# Patient Record
Sex: Female | Born: 1980 | Race: Black or African American | Hispanic: No | Marital: Single | Smoking: Current every day smoker
Health system: Southern US, Community
[De-identification: ages and names within clinical notes are randomized; demographics above are authoritative.]

---

## 2006-12-14 ENCOUNTER — Ambulatory Visit (HOSPITAL_COMMUNITY): Admission: RE | Admit: 2006-12-14 | Discharge: 2006-12-14 | Payer: Self-pay | Admitting: Obstetrics & Gynecology

## 2007-03-29 ENCOUNTER — Inpatient Hospital Stay (HOSPITAL_COMMUNITY): Admission: AD | Admit: 2007-03-29 | Discharge: 2007-04-01 | Payer: Self-pay | Admitting: Obstetrics & Gynecology

## 2007-03-29 ENCOUNTER — Ambulatory Visit: Payer: Self-pay | Admitting: Obstetrics & Gynecology

## 2010-05-26 ENCOUNTER — Emergency Department (HOSPITAL_COMMUNITY): Admission: EM | Admit: 2010-05-26 | Discharge: 2010-05-26 | Payer: Self-pay | Admitting: Emergency Medicine

## 2010-12-23 LAB — POCT URINALYSIS DIPSTICK
Bilirubin Urine: NEGATIVE
Glucose, UA: NEGATIVE mg/dL
Hgb urine dipstick: NEGATIVE
Ketones, ur: NEGATIVE mg/dL
Protein, ur: NEGATIVE mg/dL
Specific Gravity, Urine: 1.025 (ref 1.005–1.030)

## 2010-12-23 LAB — POCT RAPID STREP A (OFFICE): Streptococcus, Group A Screen (Direct): POSITIVE — AB

## 2011-02-21 NOTE — Op Note (Signed)
NAME:  Laura Coffey, Laura Coffey NO.:  [id number]   MEDICAL RECORD NO.:  [id number]          PATIENT TYPE:  INP   LOCATION:  9141                          FACILITY:  [facility]   PHYSICIAN:  Allie Bossier, MD        DATE OF BIRTH:  1980/12/06   DATE OF PROCEDURE:  DATE OF DISCHARGE:                               OPERATIVE REPORT   PREOPERATIVE DIAGNOSIS:  Multiparity, desires sterility.   POSTOPERATIVE DIAGNOSIS:  Multiparity, desires sterility.   PROCEDURE:  Application of Filshie clips.   SURGEON:  Myra C. Marice Potter, M.D.   ANESTHESIA:  Epidural, Malen Gauze, M.D.   COMPLICATIONS:  None.   ESTIMATED BLOOD LOSS:  Minimal.   SPECIMENS:  None.   FINDINGS:  Normal-appearing oviducts.   DETAILED PROCEDURE AND FINDINGS:  The risks, benefits and alternatives  of surgery were explained, understood and accepted. She understand the  1% failure rate and was able to verbalize it in the preoperative area  prior to entering the operating room. She declines alternative forms of  birth control. In the operating room, her epidural was bolused for  surgery. Her abdomen was prepped and draped in usual sterile fashion.  After adequate anesthesia was assured, approximately 10 mL of 0.25%  Marcaine was injected into the umbilical area. Careful inspection of her  umbilicus revealed a natural crease going in a transverse fashion across  her umbilicus. This area was elevated and incised with a #15 blade.  Fascia was elevated with Kocher clamps and incised with Mayo's. Using  army-navy retractors, the oviduct on the patient's right was visualized.  It was grasped with a Babcock clamp and traced to its fimbriated end. It  was then retraced to the isthmic region where a Filshie clip was placed  in the isthmic region of the tube, crossing the entire tube.  Approximately 1 mL of 0.25% Marcaine was injected on either side of the  clip for postoperative pain relief. A repeat procedure was performed on  the  left side, again visualizing the fimbriated end of the oviduct. At  the completion of the case, no bleeding was noted. Her fascia was  elevated with  Allis clamps and closed with a 0 Vicryl running nonlocking suture. A  subcuticular closure was done with 4-0 Vicryl suture. Excellent cosmetic  results were obtained. She was taken to the recovery room in stable  condition with the instrument, sponge and needle counts correct.      Allie Bossier, MD  Electronically Signed     MCD/MEDQ  D:  03/30/2007  T:  03/30/2007  Job:  [id number]

## 2011-07-26 LAB — CBC
HCT: 33.4 — ABNORMAL LOW
HCT: 35.1 — ABNORMAL LOW
Hemoglobin: 11.1 — ABNORMAL LOW
Hemoglobin: 11.5 — ABNORMAL LOW
Platelets: 330
Platelets: 336
RBC: 4.11
RDW: 16.6 — ABNORMAL HIGH

## 2011-07-26 LAB — RPR: RPR Ser Ql: NONREACTIVE

## 2012-12-09 ENCOUNTER — Encounter: Payer: Self-pay | Admitting: Obstetrics & Gynecology

## 2012-12-27 ENCOUNTER — Encounter: Payer: Self-pay | Admitting: Medical

## 2019-11-13 ENCOUNTER — Ambulatory Visit: Payer: Self-pay | Attending: Internal Medicine

## 2019-11-13 DIAGNOSIS — Z20822 Contact with and (suspected) exposure to covid-19: Secondary | ICD-10-CM

## 2019-11-14 LAB — NOVEL CORONAVIRUS, NAA: SARS-CoV-2, NAA: NOT DETECTED

## 2019-11-26 ENCOUNTER — Emergency Department (HOSPITAL_COMMUNITY): Payer: Self-pay

## 2019-11-26 ENCOUNTER — Encounter (HOSPITAL_COMMUNITY): Payer: Self-pay

## 2019-11-26 ENCOUNTER — Other Ambulatory Visit: Payer: Self-pay

## 2019-11-26 ENCOUNTER — Emergency Department (HOSPITAL_COMMUNITY)
Admission: EM | Admit: 2019-11-26 | Discharge: 2019-11-26 | Disposition: A | Discharge status: Home / Self Care | Payer: Self-pay | Attending: Emergency Medicine | Admitting: Emergency Medicine

## 2019-11-26 DIAGNOSIS — Z859 Personal history of malignant neoplasm, unspecified: Secondary | ICD-10-CM | POA: Insufficient documentation

## 2019-11-26 DIAGNOSIS — R0781 Pleurodynia: Secondary | ICD-10-CM | POA: Insufficient documentation

## 2019-11-26 DIAGNOSIS — R071 Chest pain on breathing: Secondary | ICD-10-CM | POA: Insufficient documentation

## 2019-11-26 DIAGNOSIS — M549 Dorsalgia, unspecified: Secondary | ICD-10-CM | POA: Insufficient documentation

## 2019-11-26 DIAGNOSIS — Z20822 Contact with and (suspected) exposure to covid-19: Secondary | ICD-10-CM

## 2019-11-26 DIAGNOSIS — R0789 Other chest pain: Secondary | ICD-10-CM

## 2019-11-26 DIAGNOSIS — F17200 Nicotine dependence, unspecified, uncomplicated: Secondary | ICD-10-CM | POA: Insufficient documentation

## 2019-11-26 LAB — I-STAT CHEM 8, ED
BUN: 3 mg/dL — ABNORMAL LOW (ref 6–20)
Calcium, Ion: 1.09 mmol/L — ABNORMAL LOW (ref 1.15–1.40)
Chloride: 104 mmol/L (ref 98–111)
Creatinine, Ser: 0.7 mg/dL (ref 0.44–1.00)
Glucose, Bld: 89 mg/dL (ref 70–99)
HCT: 36 % (ref 36.0–46.0)
Hemoglobin: 12.2 g/dL (ref 12.0–15.0)
Potassium: 3.8 mmol/L (ref 3.5–5.1)
Sodium: 136 mmol/L (ref 135–145)
TCO2: 23 mmol/L (ref 22–32)

## 2019-11-26 LAB — I-STAT BETA HCG BLOOD, ED (MC, WL, AP ONLY): I-stat hCG, quantitative: <5 m[IU]/mL (ref <5)

## 2019-11-26 LAB — POC SARS CORONAVIRUS 2 AG -  ED: SARS Coronavirus 2 Ag: NEGATIVE

## 2019-11-26 LAB — D-DIMER, QUANTITATIVE (NOT AT ARMC): D-Dimer, Quant: 0.33 ug/mL-FEU (ref 0.00–0.50)

## 2019-11-26 MED ORDER — ACETAMINOPHEN 500 MG PO TABS
1000.0000 mg | ORAL_TABLET | Freq: Once | ORAL | Status: AC
  Administered 2019-11-26: 17:00:00 1000 mg via ORAL
  Filled 2019-11-26: qty 2

## 2019-11-26 MED ORDER — NAPROXEN 375 MG PO TABS
375.0000 mg | ORAL_TABLET | Freq: Two times a day (BID) | ORAL | 0 refills | Status: AC
Start: 2019-11-26 — End: 2019-12-01

## 2019-11-26 MED ORDER — ALBUTEROL SULFATE HFA 108 (90 BASE) MCG/ACT IN AERS
2.0000 | INHALATION_SPRAY | Freq: Once | RESPIRATORY_TRACT | Status: AC
  Administered 2019-11-26: 2 via RESPIRATORY_TRACT
  Filled 2019-11-26: qty 6.7

## 2019-11-26 NOTE — ED Notes (Signed)
Pt ambulated, SPO2 remained 100% on RA

## 2019-11-26 NOTE — ED Provider Notes (Signed)
[facility] EMERGENCY DEPARTMENT Provider Note   CSN: [id number] Arrival date & time: 11/26/19  1453     History Chief Complaint  Patient presents with  . Shortness of Breath  . Generalized Body Aches    Laura Coffey is a 39 y.o. female.  HPI   39 year old female with a history of cholecystectomy presenting for evaluation of shortness of breath, right-sided chest and back pain.  States this started about a week ago.  The pain is worse with inspiration.  She has had a mild associated cough.  She reports subjective fevers at home.  The pain is worse when she lays on her right side.  She denies any nausea, vomiting, diarrhea.  She has never had similar symptoms in the past. Denies leg pain/swelling, hemoptysis, recent surgery/trauma, recent long travel, hormone use, personal hx of cancer, or hx of DVT/PE.    History reviewed. No pertinent past medical history.  There are no problems to display for this patient.   History reviewed. No pertinent surgical history.   OB History   No obstetric history on file.     History reviewed. No pertinent family history.  Social History   Tobacco Use  . Smoking status: Current Every Day Smoker  . Smokeless tobacco: Never Used  Substance Use Topics  . Alcohol use: Not Currently  . Drug use: Not Currently    Home Medications Prior to Admission medications   Not on File    Allergies    Patient has no known allergies.  Review of Systems   Review of Systems  Constitutional: Negative for fever.  HENT: Negative for ear pain and sore throat.   Eyes: Negative for visual disturbance.  Respiratory: Positive for cough and shortness of breath.   Cardiovascular: Positive for chest pain. Negative for palpitations and leg swelling.  Gastrointestinal: Negative for abdominal pain, constipation, diarrhea, nausea and vomiting.  Genitourinary: Negative for dysuria and hematuria.  Musculoskeletal: Positive for back  pain.  Skin: Negative for rash.  Neurological: Negative for headaches.  All other systems reviewed and are negative.   Physical Exam Updated Vital Signs BP 125/90   Pulse 80   Temp 99.1 F (37.3 C) (Oral)   Resp (!) 25   Ht 5\' 3"  (1.6 m)   Wt 52.2 kg   SpO2 100%   BMI 20.37 kg/m   Physical Exam Vitals and nursing note reviewed.  Constitutional:      General: She is not in acute distress.    Appearance: She is well-developed.  HENT:     Head: Normocephalic and atraumatic.  Eyes:     Conjunctiva/sclera: Conjunctivae normal.  Cardiovascular:     Rate and Rhythm: Normal rate and regular rhythm.     Heart sounds: No murmur.  Pulmonary:     Effort: Pulmonary effort is normal. No respiratory distress.     Breath sounds: Normal breath sounds. No decreased breath sounds, wheezing, rhonchi or rales.  Chest:     Chest wall: Tenderness (right side of the chest and mid back) present.  Abdominal:     Palpations: Abdomen is soft.     Tenderness: There is abdominal tenderness (mild epigastric). There is no guarding or rebound.  Musculoskeletal:     Cervical back: Neck supple.     Right lower leg: No tenderness. No edema.     Left lower leg: No tenderness. No edema.  Skin:    General: Skin is warm and dry.     Findings:  No rash.  Neurological:     Mental Status: She is alert.     ED Results / Procedures / Treatments   Labs (all labs ordered are listed, but only abnormal results are displayed) Labs Reviewed  POC SARS CORONAVIRUS 2 AG -  ED    EKG None  Radiology DG Chest 2 View  Result Date: 11/26/2019 CLINICAL DATA:  Chest pain EXAM: CHEST - 2 VIEW COMPARISON:  None. FINDINGS: Lungs are clear. Heart size and pulmonary vascularity are normal. No adenopathy. No pneumothorax. No bone lesions. There are surgical clips in the upper abdomen on the right. IMPRESSION: Lungs clear.  Heart size normal.  No adenopathy. Electronically Signed   By: Lowella Grip III M.D.   On:  11/26/2019 15:53    Procedures Procedures (including critical care time)  Medications Ordered in ED Medications  acetaminophen (TYLENOL) tablet 1,000 mg (1,000 mg Oral Given 11/26/19 1714)    ED Course  I have reviewed the triage vital signs and the nursing notes.  Pertinent labs & imaging results that were available during my care of the patient were reviewed by me and considered in my medical decision making (see chart for details).    MDM Rules/Calculators/A&P                      39 y/o F with right rib pain worse with inspiration. Also with cough and subjective fevers   Chem 8 with normal renal function and electrolytes. Normal hgb.  Beta hcg neg ddimer neg POC COVID neg Send out COVID obtained  cxr personally reviewed and neg for pneumonia, pneumothorax or other acute abnormality  Pt given albuterol and tylenol, and on reassessment she states her breathing has improved.  Discussed findings.  Discussed suspected diagnosis of Covid.  She also could have MSK pain given that I can reproduce her chest tenderness on exam.  Will DC with albuterol inhaler. Will also give naproxen for home.  ----  Laura Coffey was evaluated in Emergency Department on 11/26/2019 for the symptoms described in the history of present illness. She was evaluated in the context of the global COVID-19 pandemic, which necessitated consideration that the patient might be at risk for infection with the SARS-CoV-2 virus that causes COVID-19. Institutional protocols and algorithms that pertain to the evaluation of patients at risk for COVID-19 are in a state of rapid change based on information released by regulatory bodies including the CDC and federal and state organizations. These policies and algorithms were followed during the patient's care in the ED.  Final Clinical Impression(s) / ED Diagnoses Final diagnoses:  None    Rx / DC Orders ED Discharge Orders    None       Bishop Dublin 11/26/19 1946    Noemi Chapel, MD 11/28/19 1322

## 2019-11-26 NOTE — ED Notes (Signed)
Patient verbalizes understanding of discharge instructions. Opportunity for questioning and answers were provided. Armband removed by staff, pt discharged from ED ambulatory.   

## 2019-11-26 NOTE — Discharge Instructions (Addendum)
Take two puffs of the albuterol inhaler every 4 hours as needed for cough, shortness of breath, or wheezing.   Your COVID test results will be available in the next 1-2 days. If the results are positive, you will be contacted by the hospital.  If the results are positive, you should be isolated for at least 7 days since the onset of your symptoms AND >72 hours after symptoms resolution (absence of fever without the use of fever reducing medication and improvement in respiratory symptoms), whichever is longer  Please follow up with your primary care provider within 5-7 days for re-evaluation of your symptoms. If you do not have a primary care provider, information for a healthcare clinic has been provided for you to make arrangements for follow up care. Please return to the emergency department for any new or worsening symptoms including shortness of breath or chest pain.

## 2019-11-26 NOTE — ED Triage Notes (Signed)
Pt arrives POV for eval of shortness of breath and back pain worse w/ inspiration. Pt reports the pain radiates from under her ribcage around to her back. Denies fever,cough, chills, known covid contacts. NARD in triage

## 2019-11-27 LAB — NOVEL CORONAVIRUS, NAA (HOSP ORDER, SEND-OUT TO REF LAB; TAT 18-24 HRS): SARS-CoV-2, NAA: NOT DETECTED

## 2021-05-05 IMAGING — DX DG CHEST 2V
2 series · 2 of 2 positions shown · non-contrast
Comparison: None.

CLINICAL DATA: Chest pain

EXAM:
CHEST - 2 VIEW

[chest pa]
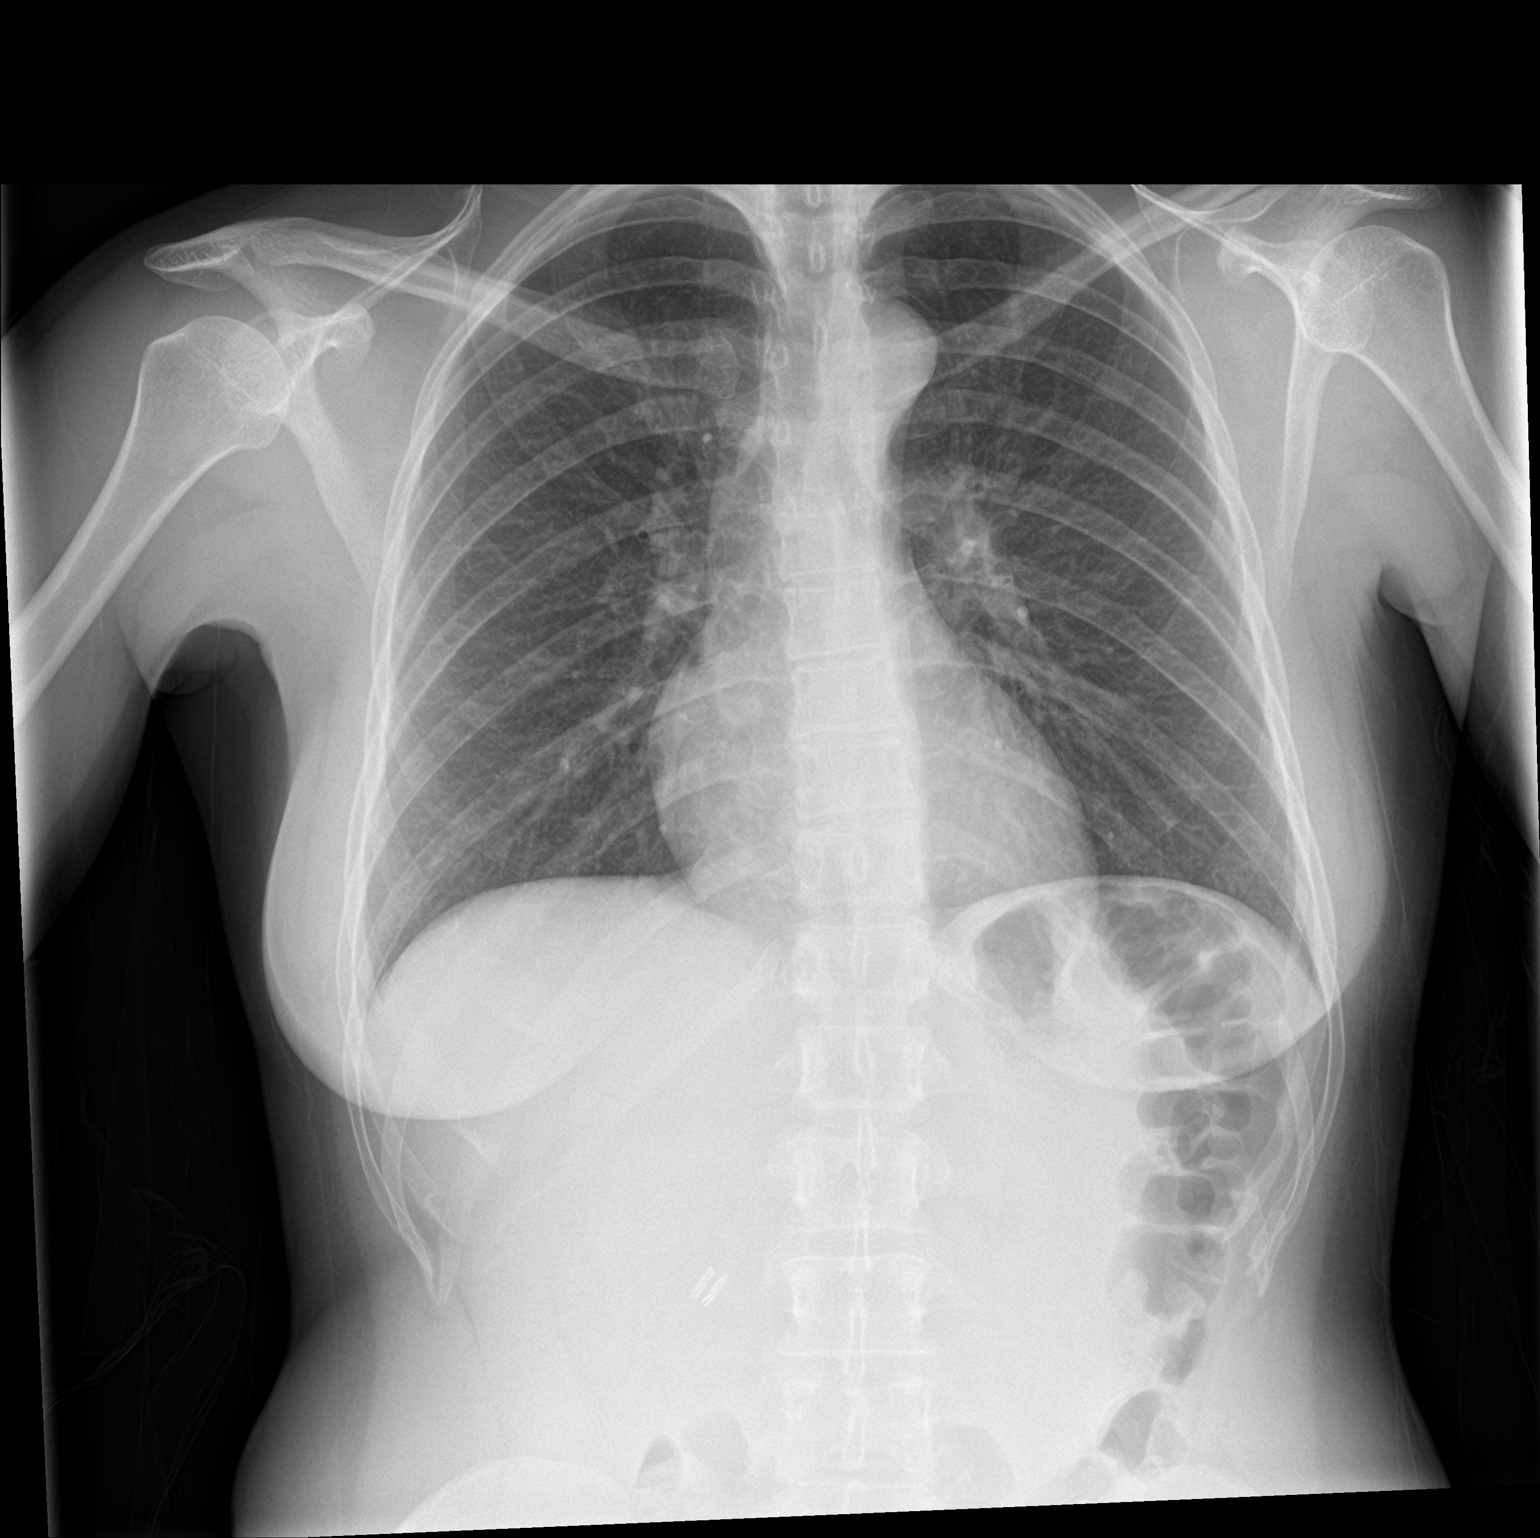

[chest lat]
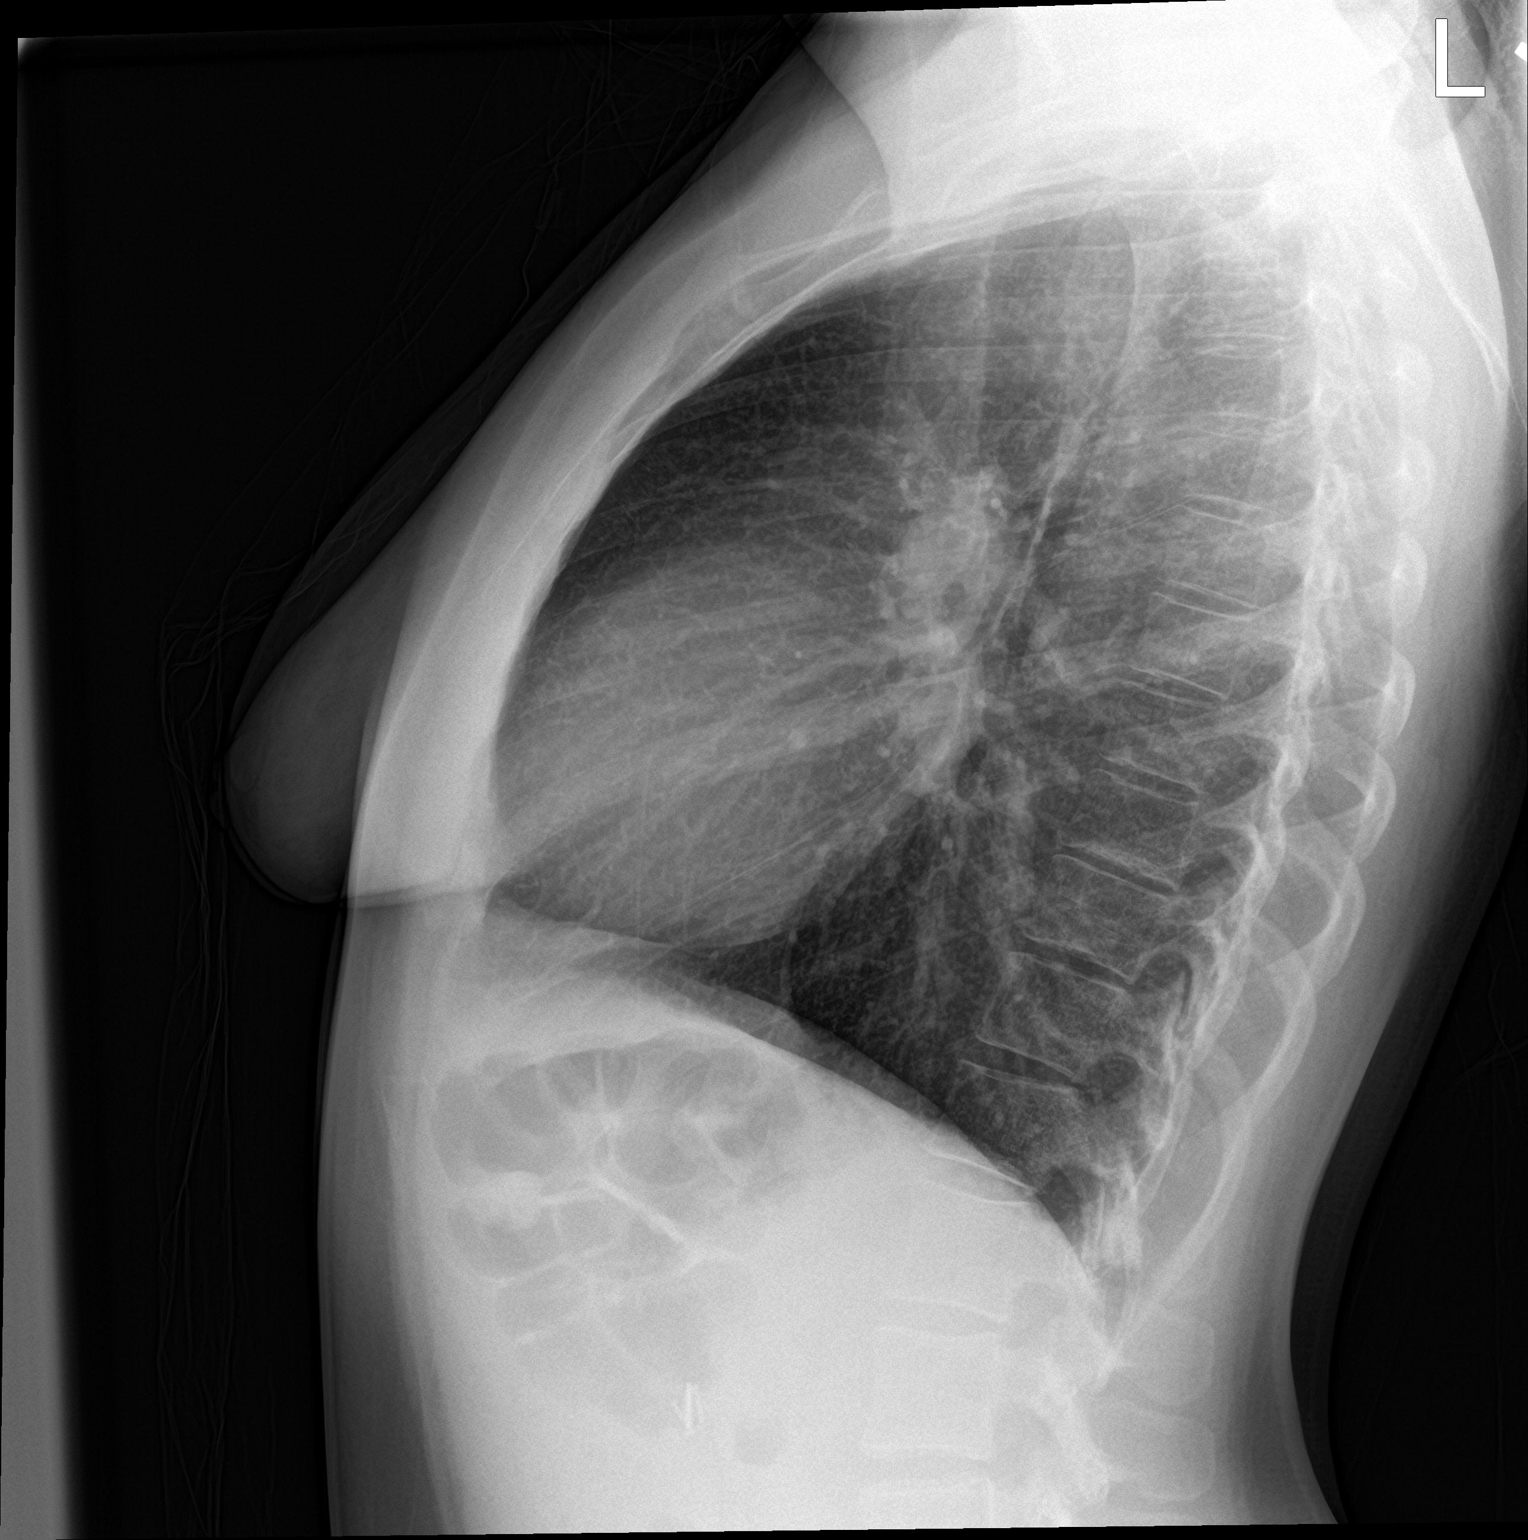

[2 of 2 positions shown; findings below may reference images not displayed]

FINDINGS: Lungs are clear. Heart size and pulmonary vascularity are normal. No
adenopathy. No pneumothorax. No bone lesions. There are surgical
clips in the upper abdomen on the right.
IMPRESSION: Lungs clear.  Heart size normal.  No adenopathy.

## 2021-09-07 ENCOUNTER — Ambulatory Visit: Payer: Self-pay

## 2022-06-02 ENCOUNTER — Emergency Department (HOSPITAL_BASED_OUTPATIENT_CLINIC_OR_DEPARTMENT_OTHER): Payer: Medicaid Other

## 2022-06-02 ENCOUNTER — Emergency Department (HOSPITAL_BASED_OUTPATIENT_CLINIC_OR_DEPARTMENT_OTHER)
Admission: EM | Admit: 2022-06-02 | Discharge: 2022-06-02 | Disposition: A | Discharge status: Home / Self Care | Payer: Medicaid Other | Attending: Emergency Medicine | Admitting: Emergency Medicine

## 2022-06-02 ENCOUNTER — Other Ambulatory Visit: Payer: Self-pay

## 2022-06-02 ENCOUNTER — Encounter (HOSPITAL_BASED_OUTPATIENT_CLINIC_OR_DEPARTMENT_OTHER): Payer: Self-pay

## 2022-06-02 DIAGNOSIS — N200 Calculus of kidney: Secondary | ICD-10-CM | POA: Insufficient documentation

## 2022-06-02 DIAGNOSIS — E876 Hypokalemia: Secondary | ICD-10-CM | POA: Diagnosis not present

## 2022-06-02 DIAGNOSIS — R1011 Right upper quadrant pain: Secondary | ICD-10-CM | POA: Diagnosis present

## 2022-06-02 DIAGNOSIS — D72829 Elevated white blood cell count, unspecified: Secondary | ICD-10-CM | POA: Insufficient documentation

## 2022-06-02 DIAGNOSIS — N12 Tubulo-interstitial nephritis, not specified as acute or chronic: Secondary | ICD-10-CM

## 2022-06-02 DIAGNOSIS — K529 Noninfective gastroenteritis and colitis, unspecified: Secondary | ICD-10-CM | POA: Diagnosis not present

## 2022-06-02 LAB — COMPREHENSIVE METABOLIC PANEL
ALT: 7 U/L (ref 0–44)
AST: 12 U/L — ABNORMAL LOW (ref 15–41)
Albumin: 4.8 g/dL (ref 3.5–5.0)
Alkaline Phosphatase: 51 U/L (ref 38–126)
Anion gap: 8 (ref 5–15)
BUN: 7 mg/dL (ref 6–20)
CO2: 27 mmol/L (ref 22–32)
Calcium: 9.5 mg/dL (ref 8.9–10.3)
Chloride: 102 mmol/L (ref 98–111)
Creatinine, Ser: 0.94 mg/dL (ref 0.44–1.00)
GFR, Estimated: >60 mL/min (ref >60)
Glucose, Bld: 99 mg/dL (ref 70–99)
Potassium: 2.9 mmol/L — ABNORMAL LOW (ref 3.5–5.1)
Sodium: 137 mmol/L (ref 135–145)
Total Bilirubin: 1 mg/dL (ref 0.3–1.2)
Total Protein: 8 g/dL (ref 6.5–8.1)

## 2022-06-02 LAB — CBC
HCT: 39.9 % (ref 36.0–46.0)
Hemoglobin: 13.8 g/dL (ref 12.0–15.0)
MCH: 32.2 pg (ref 26.0–34.0)
MCHC: 34.6 g/dL (ref 30.0–36.0)
MCV: 93 fL (ref 80.0–100.0)
Platelets: 247 10*3/uL (ref 150–400)
RBC: 4.29 MIL/uL (ref 3.87–5.11)
RDW: 13.1 % (ref 11.5–15.5)
WBC: 17 10*3/uL — ABNORMAL HIGH (ref 4.0–10.5)
nRBC: 0 % (ref 0.0–0.2)

## 2022-06-02 LAB — URINALYSIS, ROUTINE W REFLEX MICROSCOPIC
Bilirubin Urine: NEGATIVE
Glucose, UA: NEGATIVE mg/dL
Nitrite: POSITIVE — AB
Protein, ur: 30 mg/dL — AB
RBC / HPF: >50 RBC/hpf — ABNORMAL HIGH (ref 0–5)
Specific Gravity, Urine: 1.018 (ref 1.005–1.030)
pH: 5.5 (ref 5.0–8.0)

## 2022-06-02 LAB — LIPASE, BLOOD: Lipase: 24 U/L (ref 11–51)

## 2022-06-02 LAB — HCG, SERUM, QUALITATIVE: Preg, Serum: NEGATIVE

## 2022-06-02 MED ORDER — SODIUM CHLORIDE 0.9 % IV SOLN
1.0000 g | Freq: Once | INTRAVENOUS | Status: AC
  Administered 2022-06-02: 1 g via INTRAVENOUS
  Filled 2022-06-02: qty 10

## 2022-06-02 MED ORDER — IOHEXOL 300 MG/ML  SOLN
100.0000 mL | Freq: Once | INTRAMUSCULAR | Status: AC | PRN
  Administered 2022-06-02: 80 mL via INTRAVENOUS

## 2022-06-02 MED ORDER — POTASSIUM CHLORIDE ER 10 MEQ PO TBCR: 10.0000 meq | EXTENDED_RELEASE_TABLET | Freq: Every day | ORAL | 0 refills | Status: AC

## 2022-06-02 MED ORDER — POTASSIUM CHLORIDE CRYS ER 20 MEQ PO TBCR
40.0000 meq | EXTENDED_RELEASE_TABLET | Freq: Once | ORAL | Status: AC
  Administered 2022-06-02: 40 meq via ORAL
  Filled 2022-06-02: qty 2

## 2022-06-02 MED ORDER — CEPHALEXIN 500 MG PO CAPS: 500.0000 mg | ORAL_CAPSULE | Freq: Three times a day (TID) | ORAL | 0 refills | Status: AC

## 2022-06-02 NOTE — ED Triage Notes (Signed)
Patient here POV from Home.  Endorses Right lateral ABD Pain that began 2 Days ago and radiates to Epigastric ABD.  No N/V/D. Subjective Fevers. No Dysuria. Some Positions worsen Pain. Constant.   NAD Noted during Triage. A&Ox4.GCS 15. Ambulatory.

## 2022-06-02 NOTE — ED Provider Notes (Signed)
[facility] EMERGENCY DEPT Provider Note   CSN: [id number] Arrival date & time: 06/02/22  1428     History  Chief Complaint  Patient presents with   Abdominal Pain    Laura Coffey is a 41 y.o. female presenting to the ED with complaint of abdominal pain.  She reports that she has had chronic pain on the right side for many years but 2 days ago seem to get much much worse.  She describes pain in the right upper quadrant that wraps around to the epigastrium.  She says it is constant, but worse with movement.  She has not had this feeling before.  She denies dysuria, hematuria.  She denies nausea, vomiting, diarrhea.  She reports a history of a cholecystectomy.  She also reports a history of tubal ligation.  She denies any other abdominal surgeries.  HPI     Home Medications Prior to Admission medications   Medication Sig Start Date End Date Taking? Authorizing Provider  cephALEXin (KEFLEX) 500 MG capsule Take 1 capsule (500 mg total) by mouth 3 (three) times daily for 10 days. 06/03/22 06/13/22 Yes Ladye Macnaughton, Kermit Balo, MD  potassium chloride (KLOR-CON) 10 MEQ tablet Take 1 tablet (10 mEq total) by mouth daily for 30 doses. 06/02/22 07/02/22 Yes Makeisha Jentsch, Kermit Balo, MD      Allergies    Patient has no known allergies.    Review of Systems   Review of Systems  Physical Exam Updated Vital Signs BP 110/72   Pulse 76   Temp 99.1 F (37.3 C) (Oral)   Resp 17   Ht 5\' 3"  (1.6 m)   Wt 52.2 kg   SpO2 100%   BMI 20.39 kg/m  Physical Exam Constitutional:      General: She is not in acute distress. HENT:     Head: Normocephalic and atraumatic.  Eyes:     Conjunctiva/sclera: Conjunctivae normal.     Pupils: Pupils are equal, round, and reactive to light.  Cardiovascular:     Rate and Rhythm: Normal rate and regular rhythm.  Pulmonary:     Effort: Pulmonary effort is normal. No respiratory distress.  Abdominal:     General: There is no distension.      Tenderness: There is abdominal tenderness in the right upper quadrant and epigastric area.  Skin:    General: Skin is warm and dry.  Neurological:     General: No focal deficit present.     Mental Status: She is alert. Mental status is at baseline.  Psychiatric:        Mood and Affect: Mood normal.        Behavior: Behavior normal.     ED Results / Procedures / Treatments   Labs (all labs ordered are listed, but only abnormal results are displayed) Labs Reviewed  COMPREHENSIVE METABOLIC PANEL - Abnormal; Notable for the following components:      Result Value   Potassium 2.9 (*)    AST 12 (*)    All other components within normal limits  CBC - Abnormal; Notable for the following components:   WBC 17.0 (*)    All other components within normal limits  URINALYSIS, ROUTINE W REFLEX MICROSCOPIC - Abnormal; Notable for the following components:   Color, Urine ORANGE (*)    APPearance HAZY (*)    Hgb urine dipstick LARGE (*)    Ketones, ur TRACE (*)    Protein, ur 30 (*)    Nitrite POSITIVE (*)  Leukocytes,Ua SMALL (*)    RBC / HPF >50 (*)    Bacteria, UA RARE (*)    All other components within normal limits  LIPASE, BLOOD  HCG, SERUM, QUALITATIVE    EKG EKG Interpretation  Date/Time:  Friday June 02 2022 14:59:33 EDT Ventricular Rate:  105 PR Interval:  122 QRS Duration: 76 QT Interval:  320 QTC Calculation: 422 R Axis:   93 Text Interpretation: Sinus tachycardia Right atrial enlargement Rightward axis Borderline ECG When compared with ECG of 26-Nov-2019 17:00, PREVIOUS ECG IS PRESENT Confirmed by Alvester Chou [phone]) on 06/02/2022 6:33:26 PM  Radiology CT ABDOMEN PELVIS W CONTRAST  Result Date: 06/02/2022 CLINICAL DATA:  Right flank pain, UTI EXAM: CT ABDOMEN AND PELVIS WITH CONTRAST TECHNIQUE: Multidetector CT imaging of the abdomen and pelvis was performed using the standard protocol following bolus administration of intravenous contrast. RADIATION DOSE  REDUCTION: This exam was performed according to the departmental dose-optimization program which includes automated exposure control, adjustment of the mA and/or kV according to patient size and/or use of iterative reconstruction technique. CONTRAST:  80mL OMNIPAQUE IOHEXOL 300 MG/ML  SOLN COMPARISON:  None Available. FINDINGS: Lower chest: Unremarkable. Hepatobiliary: Liver measures 17 cm in length. No focal abnormalities are seen. Surgical clips are seen in gallbladder fossa. Pancreas: No focal abnormalities are seen. Spleen: Unremarkable. Adrenals/Urinary Tract: There is mild hyperplasia of left adrenal. There is no hydronephrosis. There is a 1.3 cm area of subtle decreased enhancement in the anterior midportion of right kidney. There is 11 mm smooth marginated low-density lesion in the lower pole of right kidney. Density measurements in this lesion or higher than usual for a simple cyst. There is 5 mm smoothly marginated low-density lesion in the midportion of left kidney. There are no renal or ureteral stones. Urinary bladder is unremarkable. Stomach/Bowel: Stomach is moderately distended with fluid in the lumen. There is mild dilation of proximal jejunum. Rest of the small bowel loops show no significant dilation. There is fluid in the lumen of small bowel loops. Appendix is not dilated. There is no significant wall thickening in colon. There is no pericolic stranding. Vascular/Lymphatic: Unremarkable. Reproductive: Mild lobulations are seen in the margin of the uterus. There are metallic densities in right side of pelvis, possibly suggesting previous tubal ligation. There are tortuous ectatic vessels in the adnexal regions, more so on the left side. Gonadal veins appear to be patent. Other: There is no ascites or pneumoperitoneum. Musculoskeletal: Degenerative changes are noted at the L5-S1 level. IMPRESSION: There is no evidence of intestinal obstruction or pneumoperitoneum. There is no hydronephrosis. There  is ill-defined small area of decreased cortical enhancement in the right kidney suggesting possible pyelonephritis. There are few smooth marginated low-density lesions in both kidneys with density measurements higher than usual for simple cysts. Follow-up renal sonogram and possibly multiphasic CT or MRI may be considered. There is fluid in the lumen of small bowel loops which may be a normal variation or suggest a nonspecific enteritis. Other findings as described in the body of the report. Electronically Signed   By: Ernie Avena M.D.   On: 06/02/2022 19:41    Procedures Procedures    Medications Ordered in ED Medications  potassium chloride SA (KLOR-CON M) CR tablet 40 mEq (40 mEq Oral Given 06/02/22 1849)  cefTRIAXone (ROCEPHIN) 1 g in sodium chloride 0.9 % 100 mL IVPB (0 g Intravenous Stopped 06/02/22 1933)  iohexol (OMNIPAQUE) 300 MG/ML solution 100 mL (80 mLs Intravenous Contrast Given 06/02/22 1913)  ED Course/ Medical Decision Making/ A&P                           Medical Decision Making Amount and/or Complexity of Data Reviewed Labs: ordered. Radiology: ordered.  Risk Prescription drug management.   This patient presents to the ED with concern for abdominal pain. This involves an extensive number of treatment options, and is a complaint that carries with it a high risk of complications and morbidity.  The differential diagnosis includes gastritis with peptic ulcer disease versus pyelonephritis versus UTI versus colitis versus other   I ordered and personally interpreted labs.  The pertinent results include: Hypokalemia potassium 2.9, white blood cell count elevated 17.0.  Pregnancy negative.  Lipase within normal limits.  Liver enzymes within normal limits.  UA is concentrated with leukocytes and nitrites consistent with likely infection,  I ordered imaging studies including CT abdomen pelvis I independently visualized and interpreted imaging which showed signs of  enteritis, mild right perinephric stranding may be consistent with pyelonephritis, post tubal ligation findings I agree with the radiologist interpretation  The patient was maintained on a cardiac monitor.  I personally viewed and interpreted the cardiac monitored which showed an underlying rhythm of: Sinus tachycardia  Per my interpretation the patient's ECG shows sinus tachycardia  I ordered medication including Rocephin for suspected complicated UTI. I have reviewed the patients home medicines and have made adjustments as needed  Test Considered: Doubt mesenteric ischemia  After the interventions noted above, I reevaluated the patient and found that they have: improved   Dispostion:  After consideration of the diagnostic results and the patients response to treatment, I feel that the patent would benefit from outpatient follow-up.  Hospitalization was considered at this point, but have a low suspicion for sepsis, she is clinically quite well-appearing, comfortable, and is able to tolerate p.o. fluids and take antibiotics at home.  I think a trial of therapy at home is reasonable, and she would prefer to go home as well.  A work note was provided.         Final Clinical Impression(s) / ED Diagnoses Final diagnoses:  Hypokalemia  Pyelonephritis  Enteritis    Rx / DC Orders ED Discharge Orders          Ordered    cephALEXin (KEFLEX) 500 MG capsule  3 times daily        06/02/22 2022    potassium chloride (KLOR-CON) 10 MEQ tablet  Daily        06/02/22 2022              Terald Sleeper, MD 06/02/22 2031
